# Patient Record
Sex: Male | Born: 1970 | Race: White | Hispanic: No | Marital: Married | Smoking: Former smoker
Health system: Southern US, Community
[De-identification: ages and names within clinical notes are randomized; demographics above are authoritative.]

## PROBLEM LIST (undated history)

## (undated) DIAGNOSIS — E785 Hyperlipidemia, unspecified: Secondary | ICD-10-CM

## (undated) DIAGNOSIS — F32A Depression, unspecified: Secondary | ICD-10-CM

## (undated) DIAGNOSIS — F329 Major depressive disorder, single episode, unspecified: Secondary | ICD-10-CM

## (undated) DIAGNOSIS — Z87442 Personal history of urinary calculi: Secondary | ICD-10-CM

## (undated) DIAGNOSIS — I10 Essential (primary) hypertension: Secondary | ICD-10-CM

## (undated) DIAGNOSIS — F419 Anxiety disorder, unspecified: Secondary | ICD-10-CM

## (undated) HISTORY — DX: Essential (primary) hypertension: I10

## (undated) HISTORY — PX: CARPAL TUNNEL RELEASE: SHX101

## (undated) HISTORY — DX: Personal history of urinary calculi: Z87.442

## (undated) HISTORY — DX: Major depressive disorder, single episode, unspecified: F32.9

## (undated) HISTORY — DX: Hyperlipidemia, unspecified: E78.5

## (undated) HISTORY — DX: Anxiety disorder, unspecified: F41.9

## (undated) HISTORY — PX: KIDNEY STONE SURGERY: SHX686

## (undated) HISTORY — DX: Depression, unspecified: F32.A

---

## 2006-07-10 ENCOUNTER — Ambulatory Visit: Payer: Self-pay | Admitting: Family Medicine

## 2006-12-12 ENCOUNTER — Emergency Department: Payer: Self-pay | Admitting: Emergency Medicine

## 2007-10-03 ENCOUNTER — Ambulatory Visit: Payer: Self-pay | Admitting: Urology

## 2008-06-12 ENCOUNTER — Ambulatory Visit: Payer: Self-pay | Admitting: Family Medicine

## 2008-06-26 ENCOUNTER — Ambulatory Visit: Payer: Self-pay | Admitting: Family Medicine

## 2008-07-08 ENCOUNTER — Emergency Department: Payer: Self-pay | Admitting: Emergency Medicine

## 2009-12-09 IMAGING — CR DG CHEST 2V
1 series · 2 of 2 positions shown · non-contrast
Comparison: none

REASON FOR EXAM: shortness of breath  pt in sub wait
COMMENTS:   LMP: (Male)

PROCEDURE:     DXR - DXR CHEST PA (OR AP) AND LATERAL  - July 08, 2008  [DATE]
RESULT:     The lung fields are clear. No pneumonia, pneumothorax or pleural
effusion is seen. Heart size is normal. The mediastinal and osseous
structures show no significant abnormalities.

[Series 1: view not recorded · 0.17mm/px · 2 of 2 slices shown]
[im 1/2]
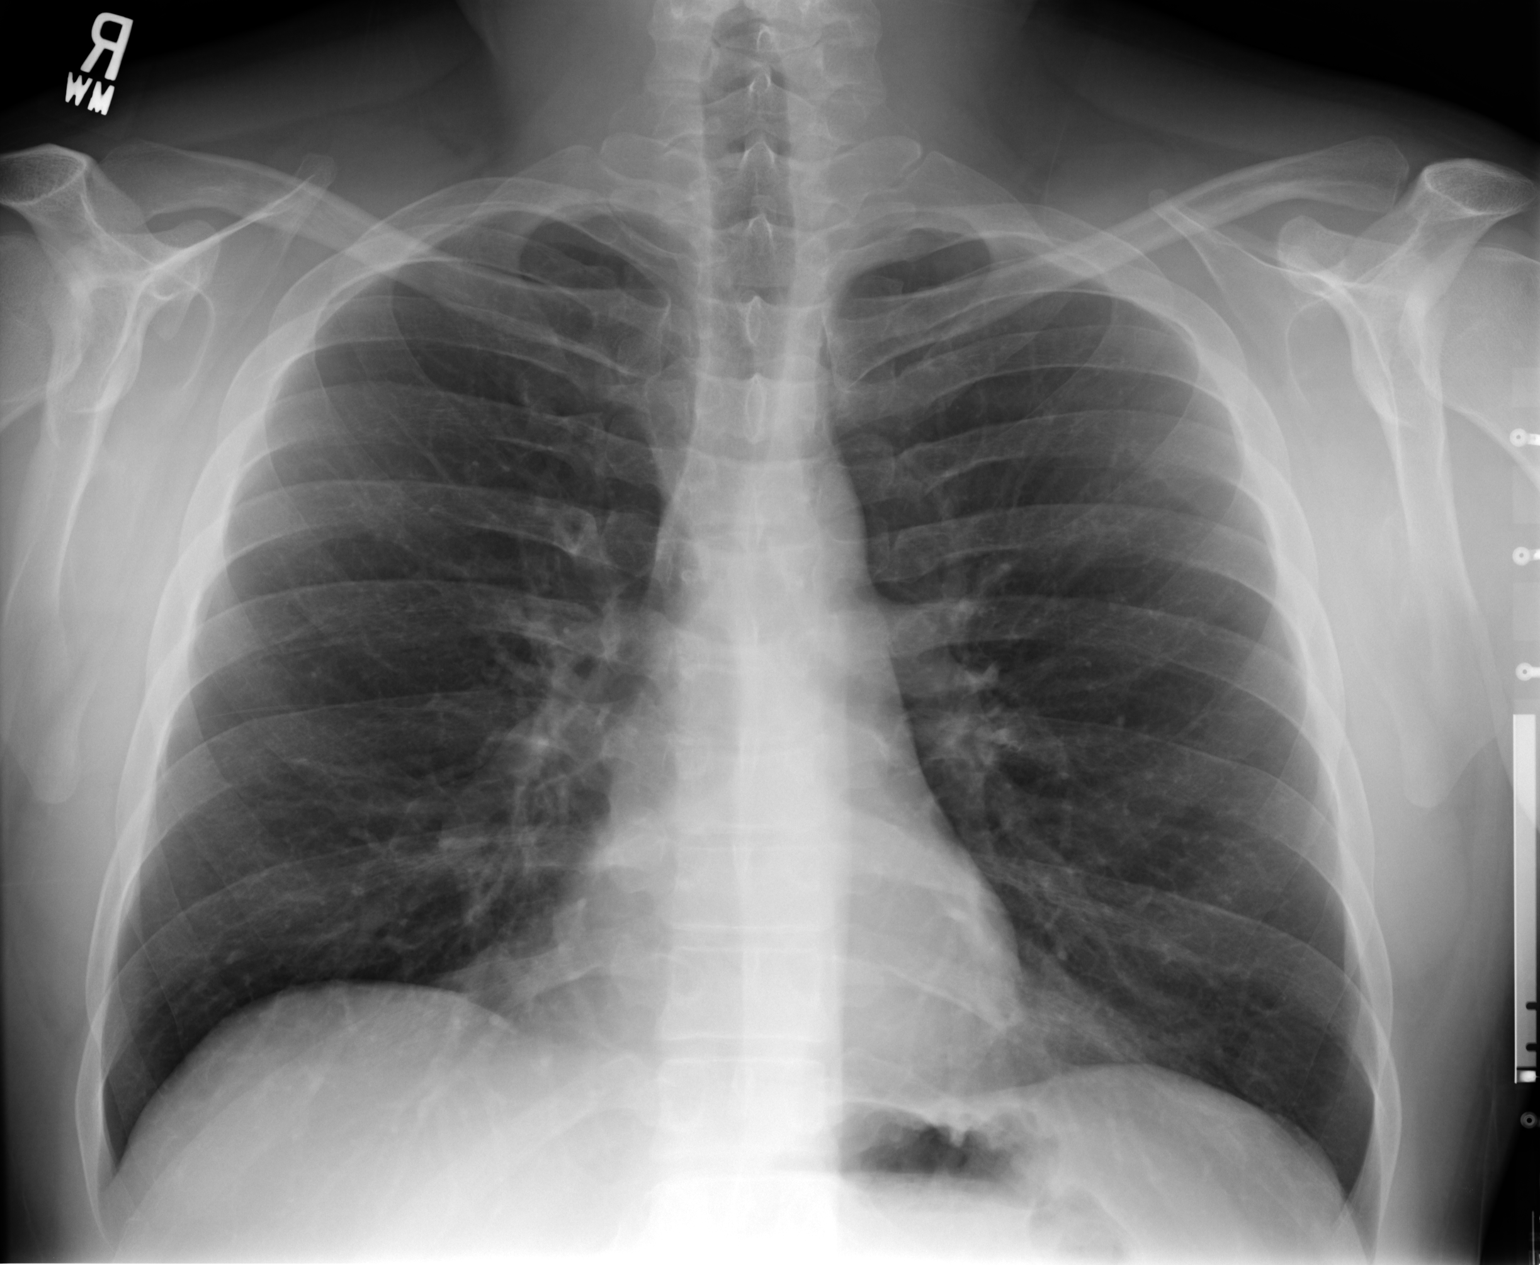
[im 2/2]
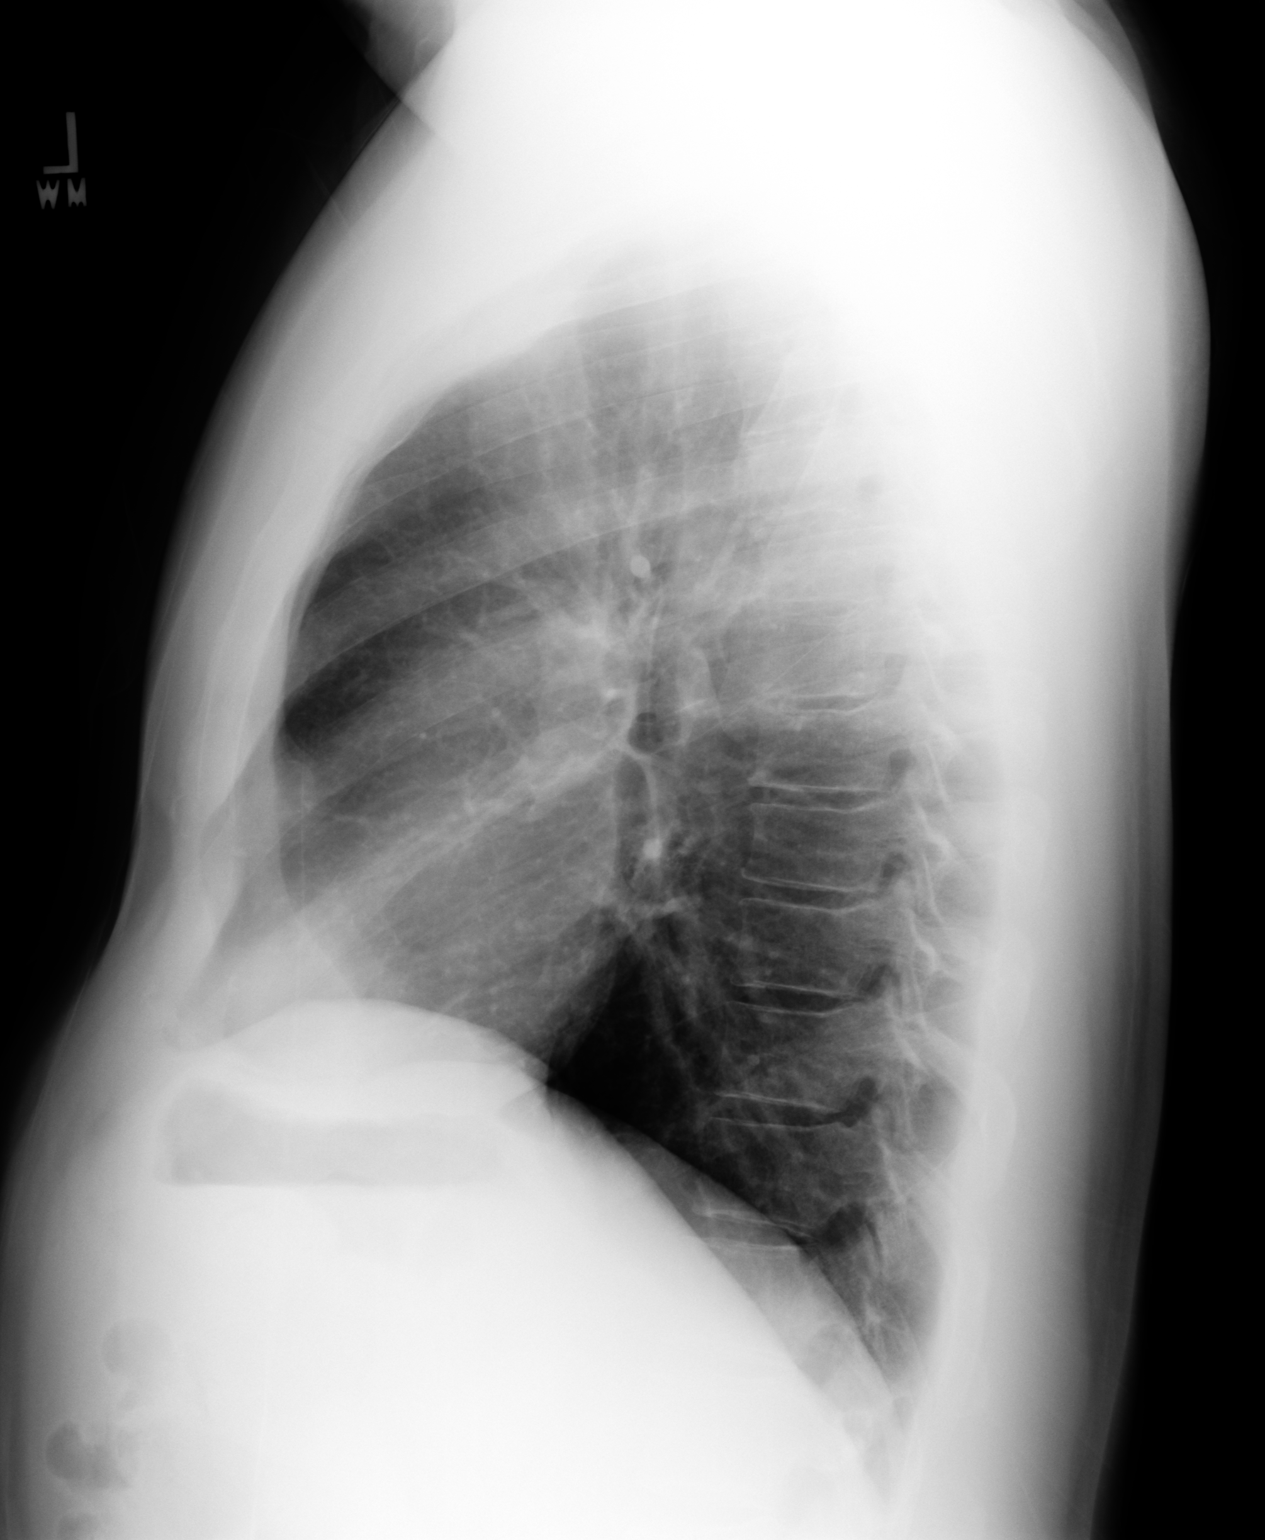

[2 of 2 positions shown; findings below may reference images not displayed]

IMPRESSION: 1.     No acute changes are identified.

## 2011-06-09 ENCOUNTER — Ambulatory Visit: Payer: Self-pay

## 2011-06-09 LAB — URINALYSIS, COMPLETE
Bacteria: NEGATIVE
Bilirubin,UR: NEGATIVE
Blood: NEGATIVE
Glucose,UR: NEGATIVE mg/dL
Ketone: NEGATIVE
Leukocyte Esterase: NEGATIVE
Nitrite: NEGATIVE
Ph: 6.5
Protein: NEGATIVE
RBC,UR: NONE SEEN /HPF
Specific Gravity: 1.02

## 2011-06-16 ENCOUNTER — Ambulatory Visit: Payer: Self-pay

## 2011-10-04 ENCOUNTER — Ambulatory Visit: Payer: Self-pay | Admitting: Family Medicine

## 2015-01-14 ENCOUNTER — Inpatient Hospital Stay: Payer: Self-pay | Admitting: Internal Medicine

## 2015-01-22 ENCOUNTER — Inpatient Hospital Stay: Payer: 59

## 2015-01-22 ENCOUNTER — Inpatient Hospital Stay: Payer: 59 | Attending: Oncology | Admitting: Oncology

## 2015-01-22 ENCOUNTER — Encounter: Payer: Self-pay | Admitting: Oncology

## 2015-01-22 VITALS — BP 122/64 | HR 69 | Temp 97.2°F | Resp 18 | Ht 68.11 in | Wt 203.3 lb

## 2015-01-22 DIAGNOSIS — Z79899 Other long term (current) drug therapy: Secondary | ICD-10-CM | POA: Diagnosis not present

## 2015-01-22 DIAGNOSIS — I10 Essential (primary) hypertension: Secondary | ICD-10-CM | POA: Diagnosis not present

## 2015-01-22 DIAGNOSIS — D72829 Elevated white blood cell count, unspecified: Secondary | ICD-10-CM

## 2015-01-22 DIAGNOSIS — Z87891 Personal history of nicotine dependence: Secondary | ICD-10-CM

## 2015-01-22 LAB — CBC WITH DIFFERENTIAL/PLATELET
BASOS ABS: 0.1 10*3/uL (ref 0–0.1)
Basophils Relative: 1 %
EOS ABS: 0.2 10*3/uL (ref 0–0.7)
Eosinophils Relative: 2 %
HEMATOCRIT: 43.7 % (ref 40.0–52.0)
Hemoglobin: 14.8 g/dL (ref 13.0–18.0)
Lymphocytes Relative: 28 %
Lymphs Abs: 3.5 10*3/uL (ref 1.0–3.6)
MCH: 29.1 pg (ref 26.0–34.0)
MCHC: 33.9 g/dL (ref 32.0–36.0)
MCV: 86 fL (ref 80.0–100.0)
MONO ABS: 1 10*3/uL (ref 0.2–1.0)
MONOS PCT: 8 %
NEUTROS ABS: 7.8 10*3/uL — AB (ref 1.4–6.5)
NEUTROS PCT: 61 %
Platelets: 243 10*3/uL (ref 150–440)
RBC: 5.08 MIL/uL (ref 4.40–5.90)
RDW: 13.1 % (ref 11.5–14.5)
WBC: 12.7 10*3/uL — ABNORMAL HIGH (ref 3.8–10.6)

## 2015-01-22 LAB — LACTATE DEHYDROGENASE: LDH: 125 U/L (ref 98–192)

## 2015-01-27 LAB — COMP PANEL: LEUKEMIA/LYMPHOMA

## 2015-01-28 ENCOUNTER — Telehealth: Payer: Self-pay | Admitting: *Deleted

## 2015-01-28 NOTE — Telephone Encounter (Signed)
Patient has appointment scheduled for November for follow up with Dr. Orlie Dakin, will have to check with him tomorrow to see if patient is to be seen sooner.

## 2015-01-29 NOTE — Telephone Encounter (Signed)
What about his results?

## 2015-01-29 NOTE — Telephone Encounter (Signed)
November is fine.  Thanks.

## 2015-01-29 NOTE — Telephone Encounter (Signed)
Flow cytometry normal, WBC count elevated but stable.  No intervention needed.

## 2015-01-29 NOTE — Progress Notes (Signed)
[facility]  [phone] [phone]  ID: Roy Kelly OB: 15-Apr-1971  MR#: [id number]  AOZ#:[id number]  Patient Care Team: Dione Housekeeper, MD as PCP - General (Family Medicine)  CHIEF COMPLAINT:  Chief Complaint  Patient presents with  . New Evaluation    hematology    INTERVAL HISTORY: Patient is a 44 year old male who was found to have an increased white blood cell count on routine blood work. Repeat testing confirmed the results.  He currently feels well and is asymptomatic. He denies any recent fevers or illnesses. He has no new medications. He denies any weight loss. He denies any chest pain or shortness of breath. He has no nausea, vomiting, constipation, or diarrhea. He has no urinary complaints. Patient feels at his baseline and offers no specific complaints today.  REVIEW OF SYSTEMS:   Review of Systems  Constitutional: Negative.   Cardiovascular: Negative.   Musculoskeletal: Negative.     As per HPI. Otherwise, a complete review of systems is negatve.  PAST MEDICAL HISTORY: Past Medical History  Diagnosis Date  . Hyperlipidemia   . Hypertension   . History of nephrolithiasis   . Depression   . Anxiety     PAST SURGICAL HISTORY: Past Surgical History  Procedure Laterality Date  . Kidney stone surgery    . Carpal tunnel release      FAMILY HISTORY Family History  Problem Relation Age of Onset  . Bone cancer Mother   . Lung cancer Mother   . Aneurysm Father   . Cervical cancer Sister   . Uterine cancer Sister        ADVANCED DIRECTIVES:    HEALTH MAINTENANCE: Social History  Substance Use Topics  . Smoking status: Former Smoker    Types: Cigarettes  . Smokeless tobacco: Former Neurosurgeon    Types: Snuff, Chew  . Alcohol Use: 0.0 oz/week    0 Standard drinks or equivalent per week     Comment: occassional     Colonoscopy:  PAP:  Bone density:  Lipid panel:  No Known Allergies  Current  Outpatient Prescriptions  Medication Sig Dispense Refill  . acetaminophen (TYLENOL) 325 MG tablet Take by mouth.    Marland Kitchen ibuprofen (ADVIL,MOTRIN) 200 MG tablet Take by mouth.    Marland Kitchen lisinopril (PRINIVIL,ZESTRIL) 20 MG tablet TAKE 1 TABLET (20 MG TOTAL) BY MOUTH ONCE DAILY.  11  . Omega-3 1000 MG CAPS Take by mouth.     No current facility-administered medications for this visit.    OBJECTIVE: Filed Vitals:   01/22/15 1404  BP: 122/64  Pulse: 69  Temp: 97.2 F (36.2 C)  Resp: 18     Body mass index is 30.81 kg/(m^2).    ECOG FS:0 - Asymptomatic  General: Well-developed, well-nourished, no acute distress. Eyes: Pink conjunctiva, anicteric sclera. HEENT: Normocephalic, moist mucous membranes, clear oropharnyx. Lungs: Clear to auscultation bilaterally. Heart: Regular rate and rhythm. No rubs, murmurs, or gallops. Abdomen: Soft, nontender, nondistended. No organomegaly noted, normoactive bowel sounds. Musculoskeletal: No edema, cyanosis, or clubbing. Neuro: Alert, answering all questions appropriately. Cranial nerves grossly intact. Skin: No rashes or petechiae noted. Psych: Normal affect. Lymphatics: No cervical, calvicular, axillary or inguinal LAD.   LAB RESULTS:  No results found for: NA, K, CL, CO2, GLUCOSE, BUN, CREATININE, CALCIUM, PROT, ALBUMIN, AST, ALT, ALKPHOS, BILITOT, GFRNONAA, GFRAA  Lab Results  Component Value Date   WBC 12.7* 01/22/2015   NEUTROABS 7.8* 01/22/2015   HGB 14.8 01/22/2015   HCT  43.7 01/22/2015   MCV 86.0 01/22/2015   PLT 243 01/22/2015     STUDIES: No results found.  ASSESSMENT: Leukocytosis.  PLAN:    1. Leukocytosis: Patient's white blood cell count continues to be mildly elevated with a neutrophil predominance. It has been essentially unchanged for greater than one year.  All of his other blood work including peripheral blood flow cytometry is either negative or within normal limits. No intervention is needed at this time. Return to  clinic in 3 months with repeat laboratory work and further evaluation. We will also draw BCR-ABL mutation at that time for completeness.  Patient expressed understanding and was in agreement with this plan. He also understands that He can call clinic at any time with any questions, concerns, or complaints.   Jeralyn Ruths, MD   01/29/2015 12:39 PM

## 2015-01-29 NOTE — Telephone Encounter (Signed)
Spoke with patient and informed him of results and to keep appt for Nov.. He repeated this to me and thanked me for calling

## 2015-01-29 NOTE — Telephone Encounter (Signed)
Attempted to return call to pt, he has no vm, so I was unable to leave a message

## 2015-04-23 ENCOUNTER — Other Ambulatory Visit: Payer: 59

## 2015-04-23 ENCOUNTER — Other Ambulatory Visit: Payer: Self-pay | Admitting: *Deleted

## 2015-04-23 ENCOUNTER — Ambulatory Visit: Payer: 59 | Admitting: Oncology

## 2015-04-23 DIAGNOSIS — D72829 Elevated white blood cell count, unspecified: Secondary | ICD-10-CM

## 2015-04-28 ENCOUNTER — Inpatient Hospital Stay: Payer: 59 | Attending: Oncology

## 2015-04-28 DIAGNOSIS — Z79899 Other long term (current) drug therapy: Secondary | ICD-10-CM | POA: Diagnosis not present

## 2015-04-28 DIAGNOSIS — D72829 Elevated white blood cell count, unspecified: Secondary | ICD-10-CM | POA: Insufficient documentation

## 2015-04-28 LAB — CBC WITH DIFFERENTIAL/PLATELET
BASOS ABS: 0 10*3/uL (ref 0–0.1)
BASOS PCT: 0 %
EOS ABS: 0.3 10*3/uL (ref 0–0.7)
EOS PCT: 3 %
HCT: 43.8 % (ref 40.0–52.0)
HEMOGLOBIN: 15.2 g/dL (ref 13.0–18.0)
LYMPHS ABS: 3.1 10*3/uL (ref 1.0–3.6)
Lymphocytes Relative: 34 %
MCH: 30 pg (ref 26.0–34.0)
MCHC: 34.6 g/dL (ref 32.0–36.0)
MCV: 86.8 fL (ref 80.0–100.0)
Monocytes Absolute: 1 10*3/uL (ref 0.2–1.0)
Monocytes Relative: 11 %
NEUTROS PCT: 52 %
Neutro Abs: 4.9 10*3/uL (ref 1.4–6.5)
PLATELETS: 270 10*3/uL (ref 150–440)
RBC: 5.05 MIL/uL (ref 4.40–5.90)
RDW: 13.2 % (ref 11.5–14.5)
WBC: 9.3 10*3/uL (ref 3.8–10.6)

## 2015-05-04 LAB — BCR-ABL1, CML/ALL, PCR, QUANT

## 2015-05-07 ENCOUNTER — Ambulatory Visit: Payer: 59 | Admitting: Oncology

## 2015-05-14 ENCOUNTER — Ambulatory Visit: Payer: 59 | Admitting: Oncology

## 2015-05-21 ENCOUNTER — Ambulatory Visit: Payer: 59 | Admitting: Oncology

## 2015-05-28 ENCOUNTER — Inpatient Hospital Stay: Payer: 59 | Attending: Oncology | Admitting: Oncology

## 2015-05-28 VITALS — BP 127/65 | HR 80 | Temp 97.7°F | Resp 18 | Wt 211.2 lb

## 2015-05-28 DIAGNOSIS — Z79899 Other long term (current) drug therapy: Secondary | ICD-10-CM | POA: Diagnosis not present

## 2015-05-28 DIAGNOSIS — F329 Major depressive disorder, single episode, unspecified: Secondary | ICD-10-CM | POA: Insufficient documentation

## 2015-05-28 DIAGNOSIS — Z87891 Personal history of nicotine dependence: Secondary | ICD-10-CM | POA: Diagnosis not present

## 2015-05-28 DIAGNOSIS — I10 Essential (primary) hypertension: Secondary | ICD-10-CM | POA: Insufficient documentation

## 2015-05-28 DIAGNOSIS — D72829 Elevated white blood cell count, unspecified: Secondary | ICD-10-CM | POA: Insufficient documentation

## 2015-05-28 DIAGNOSIS — F419 Anxiety disorder, unspecified: Secondary | ICD-10-CM | POA: Insufficient documentation

## 2015-05-28 DIAGNOSIS — E785 Hyperlipidemia, unspecified: Secondary | ICD-10-CM | POA: Insufficient documentation

## 2015-06-06 NOTE — Progress Notes (Signed)
[facility]  [phone] [phone]  ID: VIKAS WEGMANN OB: Oct 05, 1970  MR#: [id number]  MWN#:[id number]  Patient Care Team: Dione Housekeeper, MD as PCP - General (Family Medicine)  CHIEF COMPLAINT:  Chief Complaint  Patient presents with  . leukocytosis    INTERVAL HISTORY: Patient returns to clinic today for repeat laboratory work and further evaluation. He continues to feel well and is asymptomatic. He denies any recent fevers or illnesses. He denies any weight loss. He denies any chest pain or shortness of breath. He has no nausea, vomiting, constipation, or diarrhea. He has no urinary complaints. Patient feels at his baseline and offers no specific complaints today.  REVIEW OF SYSTEMS:   Review of Systems  Constitutional: Negative.  Negative for fever, weight loss and malaise/fatigue.  Respiratory: Negative for shortness of breath.   Cardiovascular: Negative.  Negative for chest pain.  Gastrointestinal: Negative.   Musculoskeletal: Negative.   Neurological: Negative.  Negative for weakness.    As per HPI. Otherwise, a complete review of systems is negatve.  PAST MEDICAL HISTORY: Past Medical History  Diagnosis Date  . Hyperlipidemia   . Hypertension   . History of nephrolithiasis   . Depression   . Anxiety     PAST SURGICAL HISTORY: Past Surgical History  Procedure Laterality Date  . Kidney stone surgery    . Carpal tunnel release      FAMILY HISTORY Family History  Problem Relation Age of Onset  . Bone cancer Mother   . Lung cancer Mother   . Aneurysm Father   . Cervical cancer Sister   . Uterine cancer Sister        ADVANCED DIRECTIVES:    HEALTH MAINTENANCE: Social History  Substance Use Topics  . Smoking status: Former Smoker    Types: Cigarettes  . Smokeless tobacco: Former Neurosurgeon    Types: Snuff, Chew  . Alcohol Use: 0.0 oz/week    0 Standard drinks or equivalent per week     Comment:  occassional     Colonoscopy:  PAP:  Bone density:  Lipid panel:  No Known Allergies  Current Outpatient Prescriptions  Medication Sig Dispense Refill  . acetaminophen (TYLENOL) 325 MG tablet Take by mouth.    Marland Kitchen ibuprofen (ADVIL,MOTRIN) 200 MG tablet Take by mouth.    Marland Kitchen lisinopril (PRINIVIL,ZESTRIL) 20 MG tablet TAKE 1 TABLET (20 MG TOTAL) BY MOUTH ONCE DAILY.  11  . Omega-3 1000 MG CAPS Take 1 capsule by mouth daily.      No current facility-administered medications for this visit.    OBJECTIVE: Filed Vitals:   05/28/15 1045  BP: 127/65  Pulse: 80  Temp: 97.7 F (36.5 C)  Resp: 18     Body mass index is 32.01 kg/(m^2).    ECOG FS:0 - Asymptomatic  General: Well-developed, well-nourished, no acute distress. Eyes: Pink conjunctiva, anicteric sclera. Lungs: Clear to auscultation bilaterally. Heart: Regular rate and rhythm. No rubs, murmurs, or gallops. Abdomen: Soft, nontender, nondistended. No organomegaly noted, normoactive bowel sounds. Musculoskeletal: No edema, cyanosis, or clubbing. Neuro: Alert, answering all questions appropriately. Cranial nerves grossly intact. Skin: No rashes or petechiae noted. Psych: Normal affect.   LAB RESULTS:  No results found for: NA, K, CL, CO2, GLUCOSE, BUN, CREATININE, CALCIUM, PROT, ALBUMIN, AST, ALT, ALKPHOS, BILITOT, GFRNONAA, GFRAA  Lab Results  Component Value Date   WBC 9.3 04/28/2015   NEUTROABS 4.9 04/28/2015   HGB 15.2 04/28/2015   HCT 43.8 04/28/2015  MCV 86.8 04/28/2015   PLT 270 04/28/2015     STUDIES: No results found.  ASSESSMENT: Leukocytosis.  PLAN:    1. Leukocytosis: Patient's white blood cell count is now within normal limits.  All of his other blood work including peripheral blood flow cytometry and BCR-ABL mutation is either negative or within normal limits. No intervention is needed at this time. No further follow-up is necessary. Please refer patient back if his white blood cell count continues  to increase.  Patient expressed understanding and was in agreement with this plan. He also understands that He can call clinic at any time with any questions, concerns, or complaints.   Jeralyn Ruthsimothy J Finnegan, MD   06/06/2015 12:02 PM

## 2015-06-28 ENCOUNTER — Ambulatory Visit
Admission: EM | Admit: 2015-06-28 | Discharge: 2015-06-28 | Disposition: A | Discharge status: Home / Self Care | Payer: 59 | Attending: Family Medicine | Admitting: Family Medicine

## 2015-06-28 ENCOUNTER — Ambulatory Visit (INDEPENDENT_AMBULATORY_CARE_PROVIDER_SITE_OTHER): Payer: 59

## 2015-06-28 DIAGNOSIS — M25511 Pain in right shoulder: Secondary | ICD-10-CM | POA: Diagnosis not present

## 2015-06-28 DIAGNOSIS — M778 Other enthesopathies, not elsewhere classified: Secondary | ICD-10-CM

## 2015-06-28 DIAGNOSIS — M7581 Other shoulder lesions, right shoulder: Secondary | ICD-10-CM | POA: Diagnosis not present

## 2015-06-28 MED ORDER — KETOROLAC TROMETHAMINE 60 MG/2ML IM SOLN
60.0000 mg | Freq: Once | INTRAMUSCULAR | Status: AC
  Administered 2015-06-28: 60 mg via INTRAMUSCULAR

## 2015-06-28 MED ORDER — MELOXICAM 15 MG PO TABS: 15.0000 mg | ORAL_TABLET | Freq: Every day | ORAL | Status: DC | PRN

## 2015-06-28 MED ORDER — TRAMADOL HCL 50 MG PO TABS: 50.0000 mg | ORAL_TABLET | Freq: Three times a day (TID) | ORAL | Status: DC | PRN

## 2015-06-28 NOTE — ED Notes (Signed)
C/o right shoulder pain x 1 1/2 weeks. Has repeticious job lifting at work and painful to lift arm above head and worse pain at night

## 2015-06-28 NOTE — ED Provider Notes (Signed)
[facility]  ____________________________________________  Time seen: Approximately 1:53 PM  I have reviewed the triage vital signs and the nursing notes.   HISTORY  Chief Complaint Shoulder Pain   HPI Roy Kelly is a 45 y.o. male presents with wife at bedside for the complaints of right shoulder pain 2-3 weeks. Patient states that the pain was intermittent longer than 2-3 weeks ago, but reports that pain is more often present over the last 2-3 weeks. Patient reports no pain at this time. Patient however reports that with any lifting of his shoulder and overhead activity he has pain in his right shoulder. Denies pain radiation. States the pain is often a burning sensation and describes as "like he worked out and lifted weights too long ". Denies fall or direct injury.  Patient reports he does a lot of repetitive motions at work including a lot of different movements of arms and shoulders. Patient reports that he does a lot of lifting at work repetitively. Patient reports that he has had a history of this in his right shoulder last year and reports was evaluated by orthopedic. States at that time they discussed doing a joint injection. Patient states that time he did not want to do a joint injection and wanted to just wait. Patient states that the pain improved on its own. Patient reports also he had a history of similar in the past with left rotator cuff.  Patient states that right shoulder pain at max is 6 out of 10. Denies pain radiation. Denies numbness or tingling sensation. Denies decreased range of motion or decrease hand grips. Denies chest pain, shortness of breath, dizziness, weakness, vision changes, abdominal pain, back pain, fevers or recent sickness. Denies other complaints.  Patient reports that he was seen by [facility] clinic orthopedic, Dr Joice Lofts.    Past Medical History  Diagnosis Date  . Hyperlipidemia   . Hypertension   . History of nephrolithiasis   .  Depression   . Anxiety     There are no active problems to display for this patient.   Past Surgical History  Procedure Laterality Date  . Kidney stone surgery    . Carpal tunnel release      Current Outpatient Rx  Name  Route  Sig  Dispense  Refill  . ibuprofen (ADVIL,MOTRIN) 200 MG tablet   Oral   Take by mouth.         Marland Kitchen lisinopril (PRINIVIL,ZESTRIL) 20 MG tablet      TAKE 1 TABLET (20 MG TOTAL) BY MOUTH ONCE DAILY.      11   . Omega-3 1000 MG CAPS   Oral   Take 1 capsule by mouth daily.          Marland Kitchen acetaminophen (TYLENOL) 325 MG tablet   Oral   Take by mouth.           Allergies Oxycodone  Family History  Problem Relation Age of Onset  . Bone cancer Mother   . Lung cancer Mother   . Aneurysm Father   . Cervical cancer Sister   . Uterine cancer Sister     Social History Social History  Substance Use Topics  . Smoking status: Former Smoker    Types: Cigarettes  . Smokeless tobacco: Former Neurosurgeon    Types: Snuff, Chew  . Alcohol Use: 0.0 oz/week    0 Standard drinks or equivalent per week     Comment: occassional    Review of Systems Constitutional: No  fever/chills Eyes: No visual changes. ENT: No sore throat. Cardiovascular: Denies chest pain. Respiratory: Denies shortness of breath. Gastrointestinal: No abdominal pain.  No nausea, no vomiting.  No diarrhea.  No constipation. Genitourinary: Negative for dysuria. Musculoskeletal: Negative for back pain. Positive right shoulder pain Skin: Negative for rash. Neurological: Negative for headaches, focal weakness or numbness.  10-point ROS otherwise negative.  ____________________________________________   PHYSICAL EXAM:  VITAL SIGNS: ED Triage Vitals  Enc Vitals Group     BP 06/28/15 1328 135/87 mmHg     Pulse Rate 06/28/15 1328 82     Resp 06/28/15 1328 16     Temp 06/28/15 1328 97.3 F (36.3 C)     Temp Source 06/28/15 1328 Tympanic     SpO2 06/28/15 1328 97 %     Weight  06/28/15 1328 213 lb (96.616 kg)     Height 06/28/15 1328  (1.753 m)     Head Cir --      Peak Flow --      Pain Score 06/28/15 1331 4     Pain Loc --      Pain Edu? --      Excl. in GC? --     Constitutional: Alert and oriented. Well appearing and in no acute distress. Eyes: Conjunctivae are normal. PERRL. EOMI. Head: Atraumatic.  Ears: no erythema, normal TMs bilaterally.   Nose: No congestion/rhinnorhea.  Mouth/Throat: Mucous membranes are moist.  Oropharynx non-erythematous. Hematological/Lymphatic/Immunilogical: No cervical lymphadenopathy. Cardiovascular: Normal rate, regular rhythm. Grossly normal heart sounds.  Good peripheral circulation. Respiratory: Normal respiratory effort.  No retractions. Lungs CTAB. Gastrointestinal: Soft and nontender.No CVA tenderness. Musculoskeletal: No lower or upper extremity tenderness nor edema.   Bilateral pedal pulses equal and easily palpated.  Except: mild pain to right anterior shoulder beneath AC joint, with resisted right arm abduction, otherwise nontender. Full active ROM. Right shoulder no erythema or edema. Negative drop arm test. Negative empty can test. 5/5 strength to bilateral upper and lower extremities. Bilateral hand grips equal. Bilateral distal radial pulses equal and easily palpated. Sensation and motor intact to bilateral upper extremities. No cervical, thoracic or lumbar tenderness.  Neurologic:  Normal speech and language. No gross focal neurologic deficits are appreciated. No gait instability. Skin:  Skin is warm, dry and intact. No rash noted. Psychiatric: Mood and affect are normal. Speech and behavior are normal.  ____________________________________________   LABS (all labs ordered are listed, but only abnormal results are displayed)  Labs Reviewed - No data to display  RADIOLOGY   EXAM: RIGHT SHOULDER - 2+ VIEW  COMPARISON: None.  FINDINGS: There is no evidence of fracture or dislocation. There is  no evidence of arthropathy or other focal bone abnormality. Soft tissues are unremarkable.  IMPRESSION: No acute osseous injury of the right shoulder.   Electronically Signed By: Elige Ko On: 06/28/2015 14:01  I, Renford Dills, personally viewed and evaluated these images (plain radiographs) as part of my medical decision making, as well as reviewing the written report by the radiologist.  ____________________________________________   PROCEDURES  Procedure(s) performed: Denies need for sling. ____________________________________________   INITIAL IMPRESSION / ASSESSMENT AND PLAN / ED COURSE  Pertinent labs & imaging results that were available during my care of the patient were reviewed by me and considered in my medical decision making (see chart for details).  Very well-appearing patient. No acute distress. Presents for complaints of intermittent right shoulder pain over the past few weeks. Patient reports that pain is only  with active movement. Patient states that the pain does not radiate. Patient denies numbness or tingling sensation. Denies decreased strength. Patient reports history of similar to right shoulder as well as left shoulder. Reports frequent repetitive motions of upper extremities at work. No tenderness at rest. Full range of motion present. Mild pain to right anterior shoulder beneath AC joint, with resisted right arm abduction. Full active ROM. Right shoulder no erythema or edema. Suspect right shoulder tendinitis vs bursitis. Discussed and demonstrated ROM exercises including pendulum exercises. Rest. mobic and prn tramadol and pcp or ortho follow up. Discussed avoidance of strenuous or repetitive motions.   Discussed follow up with Primary care physician this week. Discussed follow up and return parameters including no resolution or any worsening concerns. Patient verbalized understanding and agreed to plan.    ____________________________________________   FINAL CLINICAL IMPRESSION(S) / ED DIAGNOSES  Final diagnoses:  Right shoulder tendinitis  Right shoulder pain      Note: This dictation was prepared with Dragon dictation along with smaller phrase technology. Any transcriptional errors that result from this process are unintentional.    Renford Dills, NP 06/28/15 2023

## 2015-06-28 NOTE — Discharge Instructions (Signed)
Take medication as prescribed. Rest shoulder, but perform range of motion exercises as discussed multiple times per day. Avoid repetitive movements and strenuous activity.   Follow up with orthopedics as discussed. See above to call.   Return to Urgent care as needed for new or worsening concerns.   Return   Shoulder Pain The shoulder is the joint that connects your arms to your body. The bones that form the shoulder joint include the upper arm bone (humerus), the shoulder blade (scapula), and the collarbone (clavicle). The top of the humerus is shaped like a ball and fits into a rather flat socket on the scapula (glenoid cavity). A combination of muscles and strong, fibrous tissues that connect muscles to bones (tendons) support your shoulder joint and hold the ball in the socket. Small, fluid-filled sacs (bursae) are located in different areas of the joint. They act as cushions between the bones and the overlying soft tissues and help reduce friction between the gliding tendons and the bone as you move your arm. Your shoulder joint allows a wide range of motion in your arm. This range of motion allows you to do things like scratch your back or throw a ball. However, this range of motion also makes your shoulder more prone to pain from overuse and injury. Causes of shoulder pain can originate from both injury and overuse and usually can be grouped in the following four categories: 1. Redness, swelling, and pain (inflammation) of the tendon (tendinitis) or the bursae (bursitis). 2. Instability, such as a dislocation of the joint. 3. Inflammation of the joint (arthritis). 4. Broken bone (fracture). HOME CARE INSTRUCTIONS  1. Apply ice to the sore area.  Put ice in a plastic bag.  Place a towel between your skin and the bag.  Leave the ice on for 15-20 minutes, 3-4 times per day for the first 2 days, or as directed by your health care provider. 2. Stop using cold packs if they do not help with  the pain. 3. If you have a shoulder sling or immobilizer, wear it as long as your caregiver instructs. Only remove it to shower or bathe. Move your arm as little as possible, but keep your hand moving to prevent swelling. 4. Squeeze a soft ball or foam pad as much as possible to help prevent swelling. 5. Only take over-the-counter or prescription medicines for pain, discomfort, or fever as directed by your caregiver. SEEK MEDICAL CARE IF:  1. Your shoulder pain increases, or new pain develops in your arm, hand, or fingers. 2. Your hand or fingers become cold and numb. 3. Your pain is not relieved with medicines. SEEK IMMEDIATE MEDICAL CARE IF:  1. Your arm, hand, or fingers are numb or tingling. 2. Your arm, hand, or fingers are significantly swollen or turn white or blue. MAKE SURE YOU:  1. Understand these instructions. 2. Will watch your condition. 3. Will get help right away if you are not doing well or get worse.   This information is not intended to replace advice given to you by your health care provider. Make sure you discuss any questions you have with your health care provider.   Document Released: 03/01/2005 Document Revised: 06/12/2014 Document Reviewed: 09/14/2014 Elsevier Interactive Patient Education 2016 [employer]  Shoulder Range of Motion Exercises Shoulder range of motion (ROM) exercises are designed to keep the shoulder moving freely. They are often recommended for people who have shoulder pain. MOVEMENT EXERCISE When you are able, do this exercise 5-6 days  per week, or as told by your health care provider. Work toward doing 2 sets of 10 swings. Pendulum Exercise How To Do This Exercise Lying Down 5. Lie face-down on a bed with your abdomen close to the side of the bed. 6. Let your arm hang over the side of the bed. 7. Relax your shoulder, arm, and hand. 8. Slowly and gently swing your arm forward and back. Do not use your neck muscles to swing your arm. They  should be relaxed. If you are struggling to swing your arm, have someone gently swing it for you. When you do this exercise for the first time, swing your arm at a 15 degree angle for 15 seconds, or swing your arm 10 times. As pain lessens over time, increase the angle of the swing to 30-45 degrees. 9. Repeat steps 1-4 with the other arm. How To Do This Exercise While Standing 6. Stand next to a sturdy chair or table and hold on to it with your hand.  Bend forward at the waist.  Bend your knees slightly.  Relax your other arm and let it hang limp.  Relax the shoulder blade of the arm that is hanging and let it drop.  While keeping your shoulder relaxed, use body motion to swing your arm in small circles. The first time you do this exercise, swing your arm for about 30 seconds or 10 times. When you do it next time, swing your arm for a little longer.  Stand up tall and relax.  Repeat steps 1-7, this time changing the direction of the circles. 7. Repeat steps 1-8 with the other arm. STRETCHING EXERCISES Do these exercises 3-4 times per day on 5-6 days per week or as told by your health care provider. Work toward holding the stretch for 20 seconds. Stretching Exercise 1 4. Lift your arm straight out in front of you. 5. Bend your arm 90 degrees at the elbow (right angle) so your forearm goes across your body and looks like the letter "L." 6. Use your other arm to gently pull the elbow forward and across your body. 7. Repeat steps 1-3 with the other arm. Stretching Exercise 2 You will need a towel or rope for this exercise. 3. Bend one arm behind your back with the palm facing outward. 4. Hold a towel with your other hand. 5. Reach the arm that holds the towel above your head, and bend that arm at the elbow. Your wrist should be behind your neck. 6. Use your free hand to grab the free end of the towel. 7. With the higher hand, gently pull the towel up behind you. 8. With the lower hand,  pull the towel down behind you. 9. Repeat steps 1-6 with the other arm. STRENGTHENING EXERCISES Do each of these exercises at four different times of day (sessions) every day or as told by your health care provider. To begin with, repeat each exercise 5 times (repetitions). Work toward doing 3 sets of 12 repetitions or as told by your health care provider. Strengthening Exercise 1 You will need a light weight for this activity. As you grow stronger, you may use a heavier weight. 4. Standing with a weight in your hand, lift your arm straight out to the side until it is at the same height as your shoulder. 5. Bend your arm at 90 degrees so that your fingers are pointing to the ceiling. 6. Slowly raise your hand until your arm is straight up in the  air. 7. Repeat steps 1-3 with the other arm. Strengthening Exercise 2 You will need a light weight for this activity. As you grow stronger, you may use a heavier weight. 1. Standing with a weight in your hand, gradually move your straight arm in an arc, starting at your side, then out in front of you, then straight up over your head. 2. Gradually move your other arm in an arc, starting at your side, then out in front of you, then straight up over your head. 3. Repeat steps 1-2 with the other arm. Strengthening Exercise 3 You will need an elastic band for this activity. As you grow stronger, gradually increase the size of the bands or increase the number of bands that you use at one time. 1. While standing, hold an elastic band in one hand and raise that arm up in the air. 2. With your other hand, pull down the band until that hand is by your side. 3. Repeat steps 1-2 with the other arm.   This information is not intended to replace advice given to you by your health care provider. Make sure you discuss any questions you have with your health care provider.   Document Released: 02/18/2003 Document Revised: 10/06/2014 Document Reviewed:  05/18/2014 Elsevier Interactive Patient Education [employer]

## 2018-08-30 ENCOUNTER — Other Ambulatory Visit: Payer: Self-pay

## 2018-08-30 ENCOUNTER — Other Ambulatory Visit: Payer: Self-pay | Admitting: Family Medicine

## 2018-08-30 ENCOUNTER — Ambulatory Visit
Admission: RE | Admit: 2018-08-30 | Discharge: 2018-08-30 | Disposition: A | Discharge status: Home / Self Care | Payer: 59 | Source: Ambulatory Visit | Attending: Family Medicine | Admitting: Family Medicine

## 2018-08-30 ENCOUNTER — Ambulatory Visit (HOSPITAL_COMMUNITY)
Admission: RE | Admit: 2018-08-30 | Discharge: 2018-08-30 | Disposition: A | Discharge status: Home / Self Care | Payer: 59 | Attending: Family Medicine | Admitting: Family Medicine

## 2018-08-30 DIAGNOSIS — S91331A Puncture wound without foreign body, right foot, initial encounter: Secondary | ICD-10-CM

## 2020-01-31 IMAGING — CR RIGHT FOOT - 2 VIEW
2 series · 2 of 2 positions shown · non-contrast
Comparison: None.

CLINICAL DATA: Pain after stepping on nail

EXAM:
RIGHT FOOT - 2 VIEW

[foot ap]
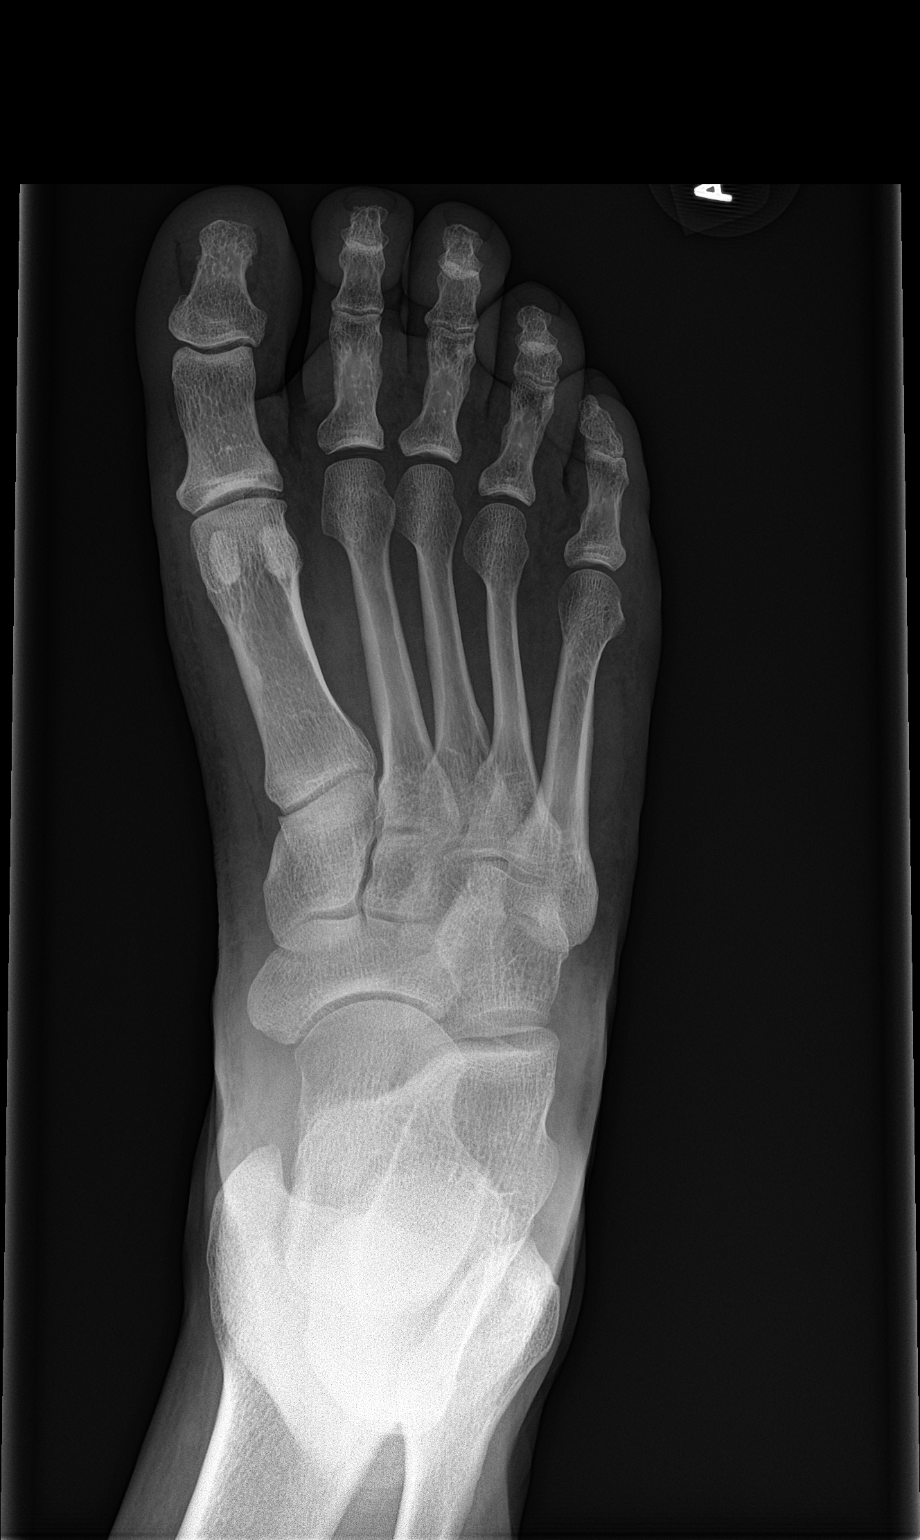

[foot lat]
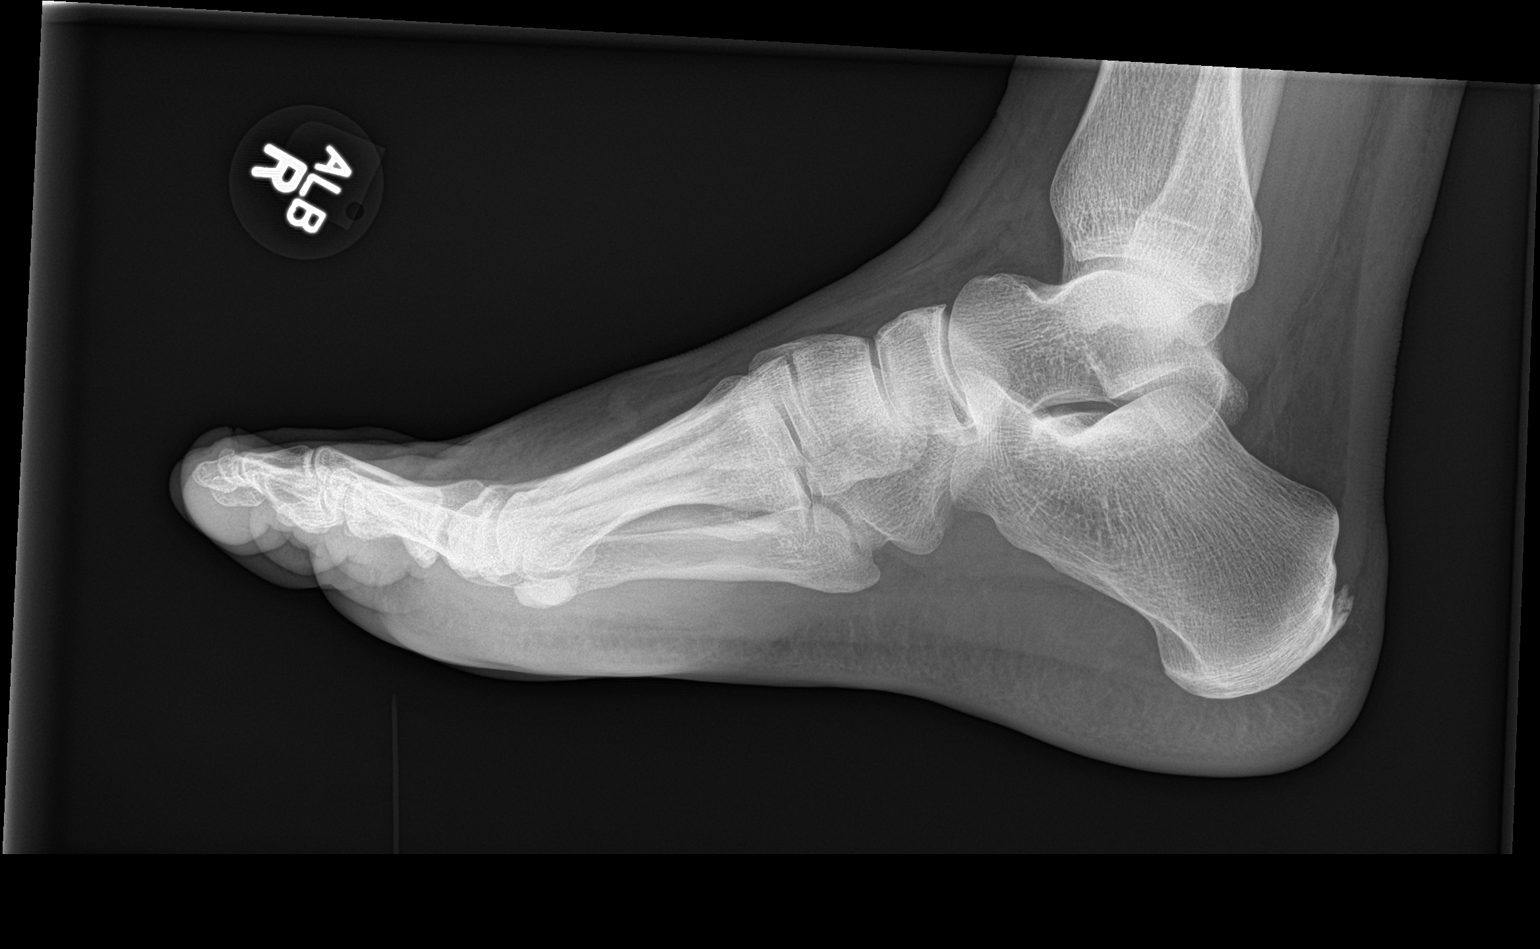

[2 of 2 positions shown; findings below may reference images not displayed]

FINDINGS: Frontal and lateral views obtained. No radiopaque foreign body or
soft tissue air. No fracture or dislocation. Joint spaces appear
normal. No erosive change or bony destruction. There is a small
posterior calcaneal spur.
IMPRESSION: Small posterior calcaneal spur. No radiopaque foreign body or soft
tissue air. Bony structures appear intact. No fracture or
dislocation. No evident arthropathy.

## 2021-08-17 ENCOUNTER — Other Ambulatory Visit: Payer: Self-pay

## 2021-08-17 ENCOUNTER — Ambulatory Visit
Admission: RE | Admit: 2021-08-17 | Discharge: 2021-08-17 | Disposition: A | Discharge status: Home / Self Care | Payer: BC Managed Care – PPO | Source: Ambulatory Visit | Attending: Emergency Medicine | Admitting: Emergency Medicine

## 2021-08-17 ENCOUNTER — Ambulatory Visit (INDEPENDENT_AMBULATORY_CARE_PROVIDER_SITE_OTHER): Payer: BC Managed Care – PPO

## 2021-08-17 VITALS — BP 138/90 | HR 78 | Temp 98.1°F | Resp 18 | Ht 69.0 in | Wt 213.0 lb

## 2021-08-17 DIAGNOSIS — J019 Acute sinusitis, unspecified: Secondary | ICD-10-CM

## 2021-08-17 DIAGNOSIS — R059 Cough, unspecified: Secondary | ICD-10-CM | POA: Diagnosis not present

## 2021-08-17 DIAGNOSIS — R0789 Other chest pain: Secondary | ICD-10-CM

## 2021-08-17 DIAGNOSIS — R051 Acute cough: Secondary | ICD-10-CM | POA: Diagnosis not present

## 2021-08-17 MED ORDER — FLUTICASONE PROPIONATE 50 MCG/ACT NA SUSP: 2.0000 | Freq: Every day | NASAL | 0 refills | Status: AC

## 2021-08-17 MED ORDER — PREDNISONE 20 MG PO TABS: 40.0000 mg | ORAL_TABLET | Freq: Every day | ORAL | 0 refills | Status: AC

## 2021-08-17 MED ORDER — AMOXICILLIN-POT CLAVULANATE 875-125 MG PO TABS: 1.0000 | ORAL_TABLET | Freq: Two times a day (BID) | ORAL | 0 refills | Status: DC

## 2021-08-17 MED ORDER — IBUPROFEN 600 MG PO TABS: 600.0000 mg | ORAL_TABLET | Freq: Four times a day (QID) | ORAL | 0 refills | Status: AC | PRN

## 2021-08-17 MED ORDER — PROMETHAZINE-DM 6.25-15 MG/5ML PO SYRP: 5.0000 mL | ORAL_SOLUTION | Freq: Four times a day (QID) | ORAL | 0 refills | Status: DC | PRN

## 2021-08-17 NOTE — ED Provider Notes (Signed)
HPI ? ?SUBJECTIVE: ? ?Roy Kelly is a 51 y.o. male who presents with nasal congestion, yellowish rhinorrhea in the morning and a mostly dry cough that is occasionally productive of yellowish sputum in the morning for the past 2 weeks.  He reports occasional chest congestion, postnasal drip, and substernal pain described as soreness that is present after a forceful bout of coughing only.  This lasts about a minute and then resolves.  No fevers, body aches, headaches, rhinorrhea, sinus pain or pressure, facial swelling, upper dental pain, wheezing, shortness of breath, dyspnea on exertion.  The chest pain is not associated with torso rotation, arm movement.  No GERD or allergy symptoms.  He was unable to sleep last night due to the cough.  He was treated for sinusitis in January with amoxicillin.  No antipyretic in the past 6 hours.  He tried allergy pills twice without improvement in his symptoms.  Symptoms are worse with lying down and in the morning.  He does not use a kerosene heater.  He quit smoking years ago.  He has a history of hypertension and is on losartan.  He had a cough with lisinopril.  He has has a history of RSV and borderline diabetes.  No history of pulmonary disease, allergies, GERD.  PCP: [facility]. ? ? ?Past Medical History:  ?Diagnosis Date  ? Anxiety   ? Depression   ? History of nephrolithiasis   ? Hyperlipidemia   ? Hypertension   ? ? ?Past Surgical History:  ?Procedure Laterality Date  ? CARPAL TUNNEL RELEASE    ? KIDNEY STONE SURGERY    ? ? ?Family History  ?Problem Relation Age of Onset  ? Bone cancer Mother   ? Lung cancer Mother   ? Aneurysm Father   ? Cervical cancer Sister   ? Uterine cancer Sister   ? ? ?Social History  ? ?Tobacco Use  ? Smoking status: Former  ?  Types: Cigarettes  ? Smokeless tobacco: Former  ?  Types: Snuff, Chew  ?Substance Use Topics  ? Alcohol use: Yes  ?  Alcohol/week: 0.0 standard drinks  ?  Comment: occassional  ? ? ?No current  facility-administered medications for this encounter. ? ?Current Outpatient Medications:  ?  amoxicillin-clavulanate (AUGMENTIN) 875-125 MG tablet, Take 1 tablet by mouth 2 (two) times daily. X 7 days, Disp: 14 tablet, Rfl: 0 ?  fluticasone (FLONASE) 50 MCG/ACT nasal spray, Place 2 sprays into both nostrils daily., Disp: 16 g, Rfl: 0 ?  ibuprofen (ADVIL) 600 MG tablet, Take 1 tablet (600 mg total) by mouth every 6 (six) hours as needed., Disp: 30 tablet, Rfl: 0 ?  predniSONE (DELTASONE) 20 MG tablet, Take 2 tablets (40 mg total) by mouth daily with breakfast for 5 days., Disp: 10 tablet, Rfl: 0 ?  promethazine-dextromethorphan (PROMETHAZINE-DM) 6.25-15 MG/5ML syrup, Take 5 mLs by mouth 4 (four) times daily as needed for cough., Disp: 118 mL, Rfl: 0 ?  acetaminophen (TYLENOL) 325 MG tablet, Take by mouth., Disp: , Rfl:  ?  lisinopril (PRINIVIL,ZESTRIL) 20 MG tablet, TAKE 1 TABLET (20 MG TOTAL) BY MOUTH ONCE DAILY., Disp: , Rfl: 11 ?  Omega-3 1000 MG CAPS, Take 1 capsule by mouth daily. , Disp: , Rfl:  ? ?Allergies  ?Allergen Reactions  ? Oxycodone Itching  ? ? ? ?ROS ? ?As noted in HPI.  ? ?Physical Exam ? ?BP 138/90 (BP Location: Left Arm)   Pulse 78   Temp 98.1 ?F (36.7 ?C) (Oral)  Resp 18   Ht 5\' 9"  (1.753 m)   Wt 96.6 kg   SpO2 99%   BMI 31.45 kg/m?  ? ?Constitutional: Well developed, well nourished, no acute distress ?Eyes:  EOMI, conjunctiva normal bilaterally ?HENT: Normocephalic, atraumatic,mucus membranes moist.  Erythematous, swollen turbinates on the right.  Mucoid nasal congestion.  No maxillary, frontal sinus tenderness.  Positive postnasal drip. ?Respiratory: Normal inspiratory effort, lungs clear bilaterally.  No chest wall tenderness, no tenderness around the sternum ?Cardiovascular: Normal rate, regular rhythm, no murmurs, rubs, gallop ?GI: nondistended ?skin: No rash, skin intact ?Musculoskeletal: no deformities ?Neurologic: Alert & oriented x 3, no focal neuro deficits ?Psychiatric: Speech  and behavior appropriate ? ? ?ED Course ? ? ?Medications - No data to display ? ?Orders Placed This Encounter  ?Procedures  ? DG Chest 2 View  ?  Standing Status:   Standing  ?  Number of Occurrences:   1  ?  Order Specific Question:   Reason for Exam (SYMPTOM  OR DIAGNOSIS REQUIRED)  ?  Answer:   cough substernal CP with cough x 2 wereks r/o PNA  ? ? ?No results found for this or any previous visit (from the past 24 hour(s)). ?DG Chest 2 View ? ?Result Date: 08/17/2021 ?CLINICAL DATA:  Cough with substernal chest pain for 2 weeks. Nasal congestion. EXAM: CHEST - 2 VIEW COMPARISON:  07/08/2008 FINDINGS: Low lung volumes are present, causing crowding of the pulmonary vasculature. The lungs appear clear. Cardiac and mediastinal contours normal. No pleural effusion identified. IMPRESSION: 1.  No active cardiopulmonary disease is radiographically apparent. 2. Low lung volumes. Electronically Signed   By: 09/05/2008 M.D.   On: 08/17/2021 18:34   ? ?ED Clinical Impression ? ?1. Acute non-recurrent sinusitis, unspecified location   ?2. Acute cough   ?  ? ?ED Assessment/Plan ? ?Checking chest x-ray given duration of symptoms. ? ?Reviewed imaging independently.  Low lung volumes.  No pleural effusion, consolidation noted.  See radiology report for full details. ? ?Patient with 2 weeks of a cough, could be from the nasal congestion/postnasal drip.  While he has no sinus tenderness, he does have purulent nasal congestion and postnasal drip.  Concern for sinusitis.  Suspect chest pain is musculoskeletal from the forceful coughing.  Deferring bronchodilators in the absence of shortness of breath, wheezing, history of pulmonary disease.  Will send home with Augmentin, Flonase, saline nasal irrigation, Promethazine DM, 40 mg of prednisone for 5 days and Tylenol/ibuprofen. ? ?Discussed imaging, MDM, treatment plan, and plan for follow-up with patient. Discussed sn/sx that should prompt return to the ED. patient agrees with  plan.  ? ?Meds ordered this encounter  ?Medications  ? fluticasone (FLONASE) 50 MCG/ACT nasal spray  ?  Sig: Place 2 sprays into both nostrils daily.  ?  Dispense:  16 g  ?  Refill:  0  ? ibuprofen (ADVIL) 600 MG tablet  ?  Sig: Take 1 tablet (600 mg total) by mouth every 6 (six) hours as needed.  ?  Dispense:  30 tablet  ?  Refill:  0  ? amoxicillin-clavulanate (AUGMENTIN) 875-125 MG tablet  ?  Sig: Take 1 tablet by mouth 2 (two) times daily. X 7 days  ?  Dispense:  14 tablet  ?  Refill:  0  ? predniSONE (DELTASONE) 20 MG tablet  ?  Sig: Take 2 tablets (40 mg total) by mouth daily with breakfast for 5 days.  ?  Dispense:  10 tablet  ?  Refill:  0  ? promethazine-dextromethorphan (PROMETHAZINE-DM) 6.25-15 MG/5ML syrup  ?  Sig: Take 5 mLs by mouth 4 (four) times daily as needed for cough.  ?  Dispense:  118 mL  ?  Refill:  0  ? ? ? ? ?*This clinic note was created using Scientist, clinical (histocompatibility and immunogenetics)Dragon dictation software. Therefore, there may be occasional mistakes despite careful proofreading. ? ?? ? ?  ?Domenick GongMortenson, Timmie Calix, MD ?08/17/21 1859 ? ?

## 2021-08-17 NOTE — Discharge Instructions (Addendum)
Your chest x-ray was negative for pneumonia.  I am concerned that you have a sinus infection that is contributing to your cough.  Finish the Augmentin, even if you feel better.  Flonase, saline nasal irrigation with a Nettie pot as often as you want.  Continue home with cough syrup, prednisone 40 mg for 5 days which will help with inflammation.  600 mg of ibuprofen combined with 1000 mg of Tylenol together 3-4 times a day as needed for pain.  Please follow-up with your doctor if not better after finishing the antibiotics ?

## 2021-08-17 NOTE — ED Triage Notes (Signed)
Pt reports nasal congestion and pain in the center of chest when coughing x 2 weeks.  ?

## 2023-07-08 ENCOUNTER — Ambulatory Visit
Admission: EM | Admit: 2023-07-08 | Discharge: 2023-07-08 | Disposition: A | Discharge status: Home / Self Care | Payer: BC Managed Care – PPO | Attending: Physician Assistant | Admitting: Physician Assistant

## 2023-07-08 ENCOUNTER — Encounter: Payer: Self-pay | Admitting: Emergency Medicine

## 2023-07-08 DIAGNOSIS — R051 Acute cough: Secondary | ICD-10-CM | POA: Diagnosis present

## 2023-07-08 DIAGNOSIS — R062 Wheezing: Secondary | ICD-10-CM | POA: Diagnosis not present

## 2023-07-08 DIAGNOSIS — J101 Influenza due to other identified influenza virus with other respiratory manifestations: Secondary | ICD-10-CM | POA: Insufficient documentation

## 2023-07-08 LAB — RESP PANEL BY RT-PCR (FLU A&B, COVID) ARPGX2
Influenza A by PCR: POSITIVE — AB
Influenza B by PCR: NEGATIVE
SARS Coronavirus 2 by RT PCR: NEGATIVE

## 2023-07-08 MED ORDER — PREDNISONE 20 MG PO TABS: 40.0000 mg | ORAL_TABLET | Freq: Every day | ORAL | 0 refills | Status: AC

## 2023-07-08 MED ORDER — ALBUTEROL SULFATE HFA 108 (90 BASE) MCG/ACT IN AERS: 1.0000 | INHALATION_SPRAY | Freq: Four times a day (QID) | RESPIRATORY_TRACT | 0 refills | Status: AC | PRN

## 2023-07-08 MED ORDER — PROMETHAZINE-DM 6.25-15 MG/5ML PO SYRP: 5.0000 mL | ORAL_SOLUTION | Freq: Four times a day (QID) | ORAL | 0 refills | Status: DC | PRN

## 2023-07-08 NOTE — ED Triage Notes (Signed)
Patient reports cough, nasal congestion, and chest congestion that started on Thursday.  Patient reports fevers.

## 2023-07-08 NOTE — ED Provider Notes (Signed)
[facility]    CSN: [id number] Arrival date & time: 07/08/23  1205      History   Chief Complaint Chief Complaint  Patient presents with   Cough    HPI KAYON DOZIER is a 53 y.o. male presenting for 3 to 4-day history of fatigue, cough, congestion, chest pressure and wheezing.  Reports fever at onset but has not had a fever in the last 2 days.  Denies sinus pain, sore throat, ear pain, chest pain, shortness of breath, vomiting or diarrhea.  His daughter and wife are ill as well.  Has taken OTC meds for symptoms but no medication taken today.  Patient is a former smoker.  No history of asthma or COPD.  HPI  Past Medical History:  Diagnosis Date   Anxiety    Depression    History of nephrolithiasis    Hyperlipidemia    Hypertension     There are no active problems to display for this patient.   Past Surgical History:  Procedure Laterality Date   CARPAL TUNNEL RELEASE     KIDNEY STONE SURGERY         Home Medications    Prior to Admission medications   Medication Sig Start Date End Date Taking? Authorizing Provider  albuterol (VENTOLIN HFA) 108 (90 Base) MCG/ACT inhaler Inhale 1-2 puffs into the lungs every 6 (six) hours as needed for wheezing or shortness of breath. 07/08/23  Yes Shirlee Latch, PA-C  predniSONE (DELTASONE) 20 MG tablet Take 2 tablets (40 mg total) by mouth daily for 5 days. 07/08/23 07/13/23 Yes Shirlee Latch, PA-C  promethazine-dextromethorphan (PROMETHAZINE-DM) 6.25-15 MG/5ML syrup Take 5 mLs by mouth 4 (four) times daily as needed. 07/08/23  Yes Shirlee Latch, PA-C  acetaminophen (TYLENOL) 325 MG tablet Take by mouth. 01/20/13   [provider]  fluticasone (FLONASE) 50 MCG/ACT nasal spray Place 2 sprays into both nostrils daily. 08/17/21   Domenick Gong, MD  ibuprofen (ADVIL) 600 MG tablet Take 1 tablet (600 mg total) by mouth every 6 (six) hours as needed. 08/17/21   Domenick Gong, MD  lisinopril (PRINIVIL,ZESTRIL) 20 MG  tablet TAKE 1 TABLET (20 MG TOTAL) BY MOUTH ONCE DAILY. 12/17/14   [provider]  Omega-3 1000 MG CAPS Take 1 capsule by mouth daily.     [provider]    Family History Family History  Problem Relation Age of Onset   Bone cancer Mother    Lung cancer Mother    Aneurysm Father    Cervical cancer Sister    Uterine cancer Sister     Social History Social History   Tobacco Use   Smoking status: Former    Types: Cigarettes   Smokeless tobacco: Former    Types: Snuff, Chew  Vaping Use   Vaping status: Never Used  Substance Use Topics   Alcohol use: Yes    Alcohol/week: 0.0 standard drinks of alcohol    Comment: occassional   Drug use: Never     Allergies   Oxycodone   Review of Systems Review of Systems  Constitutional:  Positive for fatigue and fever.  HENT:  Positive for congestion and rhinorrhea. Negative for sinus pressure, sinus pain and sore throat.   Respiratory:  Positive for cough and wheezing. Negative for shortness of breath.   Cardiovascular:  Negative for chest pain.  Gastrointestinal:  Negative for abdominal pain, diarrhea, nausea and vomiting.  Musculoskeletal:  Positive for myalgias.  Neurological:  Positive for  headaches. Negative for weakness and light-headedness.  Hematological:  Negative for adenopathy.     Physical Exam Triage Vital Signs ED Triage Vitals  Encounter Vitals Group     BP 07/08/23 1220 121/85     Systolic BP Percentile --      Diastolic BP Percentile --      Pulse Rate 07/08/23 1220 73     Resp 07/08/23 1220 15     Temp 07/08/23 1220 98.4 F (36.9 C)     Temp Source 07/08/23 1220 Oral     SpO2 07/08/23 1220 98 %     Weight 07/08/23 1217 212 lb 15.4 oz (96.6 kg)     Height 07/08/23 1217 5\' 9"  (1.753 m)     Head Circumference --      Peak Flow --      Pain Score 07/08/23 1217 3     Pain Loc --      Pain Education --      Exclude from Growth Chart --    No data found.  Updated Vital Signs BP  121/85 (BP Location: Left Arm)   Pulse 73   Temp 98.4 F (36.9 C) (Oral)   Resp 15   Ht 5\' 9"  (1.753 m)   Wt 212 lb 15.4 oz (96.6 kg)   SpO2 98%   BMI 31.45 kg/m    Physical Exam Vitals and nursing note reviewed.  Constitutional:      General: He is not in acute distress.    Appearance: Normal appearance. He is well-developed. He is not ill-appearing.  HENT:     Head: Normocephalic and atraumatic.     Nose: Congestion present.     Mouth/Throat:     Mouth: Mucous membranes are moist.     Pharynx: Oropharynx is clear. No posterior oropharyngeal erythema.  Eyes:     General: No scleral icterus.    Conjunctiva/sclera: Conjunctivae normal.  Cardiovascular:     Rate and Rhythm: Normal rate and regular rhythm.     Heart sounds: Normal heart sounds.  Pulmonary:     Effort: Pulmonary effort is normal. No respiratory distress.     Breath sounds: Wheezing present.  Musculoskeletal:     Cervical back: Neck supple.  Skin:    General: Skin is warm and dry.     Capillary Refill: Capillary refill takes less than 2 seconds.  Neurological:     General: No focal deficit present.     Mental Status: He is alert. Mental status is at baseline.     Motor: No weakness.     Gait: Gait normal.  Psychiatric:        Mood and Affect: Mood normal.        Behavior: Behavior normal.      UC Treatments / Results  Labs (all labs ordered are listed, but only abnormal results are displayed) Labs Reviewed  RESP PANEL BY RT-PCR (FLU A&B, COVID) ARPGX2 - Abnormal; Notable for the following components:      Result Value   Influenza A by PCR POSITIVE (*)    All other components within normal limits    EKG   Radiology No results found.  Procedures Procedures (including critical care time)  Medications Ordered in UC Medications - No data to display  Initial Impression / Assessment and Plan / UC Course  I have reviewed the triage vital signs and the nursing notes.  Pertinent labs &  imaging results that were available during my care of the patient  were reviewed by me and considered in my medical decision making (see chart for details).   53 year old male presents with daughter for 3 to 4-day history of cough, congestion, fatigue and wheezing.  Had a fever at onset but has broken over the past 2 days.  Vitals normal and stable.  He is overall well-appearing.  No acute distress.  On exam he has nasal congestion.  Throat is clear.  Scattered wheezes throughout all lung fields.  Positive flu A.  Patient outside the treatment window for Tamiflu to be effective.  I reviewed this with him.  Reviewed current CDC guidelines, isolation protocol and ED precautions.  Low suspicion for pneumonia since he has broken the fever.  Suspect he likely has influenza bronchitis.  Treating as so at this time with prednisone, Promethazine DM and albuterol inhaler.  Encouraged increasing rest and fluids.  Advised to return if the fever returns, cough worsens, he has worsening wheezing or breathing trouble.  Work note given.   Final Clinical Impressions(s) / UC Diagnoses   Final diagnoses:  Influenza A  Acute cough  Wheezing     Discharge Instructions      - Flu is positive.   - Sent cough medicine, and an inhaler.  You have some wheezing.  Hopefully this gets better soon.  However, sometimes people are sick with flu symptoms for couple weeks.  If you have a return of the fever, worsening cough or worsening breathing problem please return for reevaluation and chest x-ray. - You need to isolate until you are fever free for 24 hours and symptoms are improving. - Increase rest and fluids. - You should be seen again if you have uncontrolled fever, weakness or worsening breathing problem.     ED Prescriptions     Medication Sig Dispense Auth. Provider   promethazine-dextromethorphan (PROMETHAZINE-DM) 6.25-15 MG/5ML syrup Take 5 mLs by mouth 4 (four) times daily as needed. 118 mL Eusebio Friendly B, PA-C   predniSONE (DELTASONE) 20 MG tablet Take 2 tablets (40 mg total) by mouth daily for 5 days. 10 tablet Eusebio Friendly B, PA-C   albuterol (VENTOLIN HFA) 108 (90 Base) MCG/ACT inhaler Inhale 1-2 puffs into the lungs every 6 (six) hours as needed for wheezing or shortness of breath. 1 g Shirlee Latch, PA-C      PDMP not reviewed this encounter.   Shirlee Latch, PA-C 07/08/23 [phone]

## 2023-07-08 NOTE — Discharge Instructions (Signed)
-   Flu is positive.   - Sent cough medicine, and an inhaler.  You have some wheezing.  Hopefully this gets better soon.  However, sometimes people are sick with flu symptoms for couple weeks.  If you have a return of the fever, worsening cough or worsening breathing problem please return for reevaluation and chest x-ray. - You need to isolate until you are fever free for 24 hours and symptoms are improving. - Increase rest and fluids. - You should be seen again if you have uncontrolled fever, weakness or worsening breathing problem.

## 2023-08-04 ENCOUNTER — Ambulatory Visit
Admission: EM | Admit: 2023-08-04 | Discharge: 2023-08-04 | Disposition: A | Discharge status: Home / Self Care | Attending: Physician Assistant | Admitting: Physician Assistant

## 2023-08-04 ENCOUNTER — Ambulatory Visit (INDEPENDENT_AMBULATORY_CARE_PROVIDER_SITE_OTHER)

## 2023-08-04 ENCOUNTER — Encounter: Payer: Self-pay | Admitting: Emergency Medicine

## 2023-08-04 DIAGNOSIS — R052 Subacute cough: Secondary | ICD-10-CM

## 2023-08-04 DIAGNOSIS — R062 Wheezing: Secondary | ICD-10-CM

## 2023-08-04 DIAGNOSIS — I10 Essential (primary) hypertension: Secondary | ICD-10-CM | POA: Diagnosis not present

## 2023-08-04 DIAGNOSIS — J209 Acute bronchitis, unspecified: Secondary | ICD-10-CM

## 2023-08-04 MED ORDER — DOXYCYCLINE HYCLATE 100 MG PO CAPS: 100.0000 mg | ORAL_CAPSULE | Freq: Two times a day (BID) | ORAL | 0 refills | Status: AC

## 2023-08-04 MED ORDER — PSEUDOEPH-BROMPHEN-DM 30-2-10 MG/5ML PO SYRP: 10.0000 mL | ORAL_SOLUTION | Freq: Four times a day (QID) | ORAL | 0 refills | Status: AC | PRN

## 2023-08-04 NOTE — ED Provider Notes (Signed)
 [facility]    CSN: [id number] Arrival date & time: 08/04/23  0844      History   Chief Complaint Chief Complaint  Patient presents with   Cough    HPI Roy Kelly is a 53 y.o. male presenting for 3.5 week history of cough and congestion. Denies fever, fatigue, chest pressure, or shortness of breath. He was wheezing until a few days ago. Diagnosed with the flu at this department on 07/08/23. He did have wheezing at that time and was thought to have bronchitis.  He was treated with an albuterol inhaler, prednisone and Promethazine DM.  He reports his symptoms have gotten better but he continues to cough and did not know if he needed further treatment. Denies sinus pain, sore throat, and ear pain.  His daughter and wife have been ill as well. Patient is a former smoker.  No history of asthma or COPD.  History significant for anxiety, depression, hypertension and hyperlipidemia.  HPI  Past Medical History:  Diagnosis Date   Anxiety    Depression    History of nephrolithiasis    Hyperlipidemia    Hypertension     There are no active problems to display for this patient.   Past Surgical History:  Procedure Laterality Date   CARPAL TUNNEL RELEASE     KIDNEY STONE SURGERY         Home Medications    Prior to Admission medications   Medication Sig Start Date End Date Taking? Authorizing Provider  brompheniramine-pseudoephedrine-DM 30-2-10 MG/5ML syrup Take 10 mLs by mouth 4 (four) times daily as needed for up to 7 days. 08/04/23 08/11/23 Yes Shirlee Latch, PA-C  doxycycline (VIBRAMYCIN) 100 MG capsule Take 1 capsule (100 mg total) by mouth 2 (two) times daily for 7 days. 08/04/23 08/11/23 Yes Shirlee Latch, PA-C  acetaminophen (TYLENOL) 325 MG tablet Take by mouth. 01/20/13   [provider]  albuterol (VENTOLIN HFA) 108 (90 Base) MCG/ACT inhaler Inhale 1-2 puffs into the lungs every 6 (six) hours as needed for wheezing or shortness of breath. 07/08/23    Eusebio Friendly B, PA-C  fluticasone (FLONASE) 50 MCG/ACT nasal spray Place 2 sprays into both nostrils daily. 08/17/21   Domenick Gong, MD  ibuprofen (ADVIL) 600 MG tablet Take 1 tablet (600 mg total) by mouth every 6 (six) hours as needed. 08/17/21   Domenick Gong, MD  lisinopril (PRINIVIL,ZESTRIL) 20 MG tablet TAKE 1 TABLET (20 MG TOTAL) BY MOUTH ONCE DAILY. 12/17/14   [provider]  Omega-3 1000 MG CAPS Take 1 capsule by mouth daily.     [provider]    Family History Family History  Problem Relation Age of Onset   Bone cancer Mother    Lung cancer Mother    Aneurysm Father    Cervical cancer Sister    Uterine cancer Sister     Social History Social History   Tobacco Use   Smoking status: Former    Types: Cigarettes   Smokeless tobacco: Former    Types: Snuff, Chew  Vaping Use   Vaping status: Never Used  Substance Use Topics   Alcohol use: Yes    Alcohol/week: 0.0 standard drinks of alcohol    Comment: occassional   Drug use: Never     Allergies   Oxycodone   Review of Systems Review of Systems  Constitutional:  Negative for fatigue and fever.  HENT:  Positive for congestion and rhinorrhea. Negative for sinus pressure, sinus  pain and sore throat.   Respiratory:  Positive for cough and wheezing. Negative for shortness of breath.   Cardiovascular:  Negative for chest pain.  Gastrointestinal:  Negative for abdominal pain, diarrhea, nausea and vomiting.  Musculoskeletal:  Negative for myalgias.  Neurological:  Negative for weakness, light-headedness and headaches.  Hematological:  Negative for adenopathy.     Physical Exam Triage Vital Signs  No data found.  Updated Vital Signs BP (!) 136/99 (BP Location: Left Arm)   Pulse 84   Temp 98.4 F (36.9 C) (Oral)   Resp 14   Ht 5\' 9"  (1.753 m)   Wt 212 lb 15.4 oz (96.6 kg)   SpO2 97%   BMI 31.45 kg/m    Physical Exam Vitals and nursing note reviewed.  Constitutional:       General: He is not in acute distress.    Appearance: Normal appearance. He is well-developed. He is not ill-appearing.  HENT:     Head: Normocephalic and atraumatic.     Right Ear: Tympanic membrane, ear canal and external ear normal.     Left Ear: Tympanic membrane, ear canal and external ear normal.     Nose: Congestion present.     Mouth/Throat:     Mouth: Mucous membranes are moist.     Pharynx: Oropharynx is clear. No posterior oropharyngeal erythema.  Eyes:     General: No scleral icterus.    Conjunctiva/sclera: Conjunctivae normal.  Cardiovascular:     Rate and Rhythm: Normal rate and regular rhythm.     Heart sounds: Normal heart sounds.  Pulmonary:     Effort: Pulmonary effort is normal. No respiratory distress.     Breath sounds: Rhonchi (few scattered rhonchi which clear with cough) present. No wheezing.  Musculoskeletal:     Cervical back: Neck supple.  Skin:    General: Skin is warm and dry.     Capillary Refill: Capillary refill takes less than 2 seconds.  Neurological:     General: No focal deficit present.     Mental Status: He is alert. Mental status is at baseline.     Motor: No weakness.     Gait: Gait normal.  Psychiatric:        Mood and Affect: Mood normal.        Behavior: Behavior normal.      UC Treatments / Results  Labs (all labs ordered are listed, but only abnormal results are displayed) Labs Reviewed - No data to display   EKG   Radiology DG Chest 2 View Result Date: 08/04/2023 CLINICAL DATA:  Ongoing cough and congestion for 4 weeks. EXAM: CHEST - 2 VIEW COMPARISON:  Radiographs 08/17/2021 and 07/08/2008. FINDINGS: Significantly interval improved aeration of both lungs compared with the previous study. The heart size and mediastinal contours are normal. The lungs are clear. There is no pleural effusion or pneumothorax. No acute osseous findings are identified. IMPRESSION: No evidence of active cardiopulmonary process. Electronically Signed    By: Carey Bullocks M.D.   On: 08/04/2023 10:18    Procedures Procedures (including critical care time)  Medications Ordered in UC Medications - No data to display  Initial Impression / Assessment and Plan / UC Course  I have reviewed the triage vital signs and the nursing notes.  Pertinent labs & imaging results that were available during my care of the patient were reviewed by me and considered in my medical decision making (see chart for details).   53 year old male presents with  daughter for 3.5-week history of cough, congestion, and wheezing.  Had a fever at onset but has broken a few weeks ago.  Diagnosed with influenza in this department by myself on 07/08/2023.  Treated that time for influenza bronchitis with prednisone, ProAir and Promethazine DM.  Vitals stable.  BP elevated at 136/99.  History of hypertension.  Takes lisinopril. Advised to continue taking.  Keep a log.  If consistently over 140 or 90, may need medication adjustment.   He is overall well-appearing.  No acute distress.  He does cough frequently.  On exam he has nasal congestion.  Throat is clear.  Few scattered rhonchi throughout upper lung fields, which clear with cough  Chest x-ray obtained today shows no acute abnormalities.  Reviewed this with patient.  Advised patient symptoms are consistent with viral bronchitis related to the flu.  Sent Bromfed-DM to pharmacy for cough and encouraged increasing rest and fluids.  Advised if no improvement in the next week or so to take the doxycycline to cover for developing bacterial infection but explained that bronchitis can last up to 6 weeks.  Continue to use inhaler as needed.  Advised to return if the fever returns, cough worsens, he has worsening wheezing or breathing trouble.    Final Clinical Impressions(s) / UC Diagnoses   Final diagnoses:  Acute bronchitis, unspecified organism  Wheezing  Subacute cough  Essential hypertension     Discharge Instructions       -X-ray is normal. No pneumonia. You have viral flu bronchitis which can last up to 6 weeks. -I sent more cough medicine.  Using inhaler.  Increase rest and fluids. - You need to be seen again if you develop a fever, have sudden worsening of cough, pain in chest or you begin to feel short of breath. - You may take the antibiotic if desired but symptoms are likely still related to flu.      ED Prescriptions     Medication Sig Dispense Auth. Provider   brompheniramine-pseudoephedrine-DM 30-2-10 MG/5ML syrup Take 10 mLs by mouth 4 (four) times daily as needed for up to 7 days. 150 mL Eusebio Friendly B, PA-C   doxycycline (VIBRAMYCIN) 100 MG capsule Take 1 capsule (100 mg total) by mouth 2 (two) times daily for 7 days. 14 capsule Shirlee Latch, PA-C      PDMP not reviewed this encounter.      Shirlee Latch, PA-C 08/04/23 1110

## 2023-08-04 NOTE — ED Triage Notes (Signed)
 Patient c/o ongoing cough and chest congestion for 4 weeks.  Patient unsure of fevers.

## 2023-08-04 NOTE — Discharge Instructions (Addendum)
-  X-ray is normal. No pneumonia. You have viral flu bronchitis which can last up to 6 weeks. -I sent more cough medicine.  Using inhaler.  Increase rest and fluids. - You need to be seen again if you develop a fever, have sudden worsening of cough, pain in chest or you begin to feel short of breath. - You may take the antibiotic if desired but symptoms are likely still related to flu.
